# Patient Record
Sex: Female | Born: 1955 | Race: White | Hispanic: No | Marital: Single
Health system: Southern US, Community
[De-identification: ages and names within clinical notes are randomized; demographics above are authoritative.]

## PROBLEM LIST (undated history)

## (undated) DIAGNOSIS — G4733 Obstructive sleep apnea (adult) (pediatric): Secondary | ICD-10-CM

## (undated) DIAGNOSIS — J9621 Acute and chronic respiratory failure with hypoxia: Secondary | ICD-10-CM

## (undated) DIAGNOSIS — A419 Sepsis, unspecified organism: Secondary | ICD-10-CM

## (undated) DIAGNOSIS — U071 COVID-19: Secondary | ICD-10-CM

## (undated) DIAGNOSIS — R6520 Severe sepsis without septic shock: Secondary | ICD-10-CM

---

## 2021-02-04 ENCOUNTER — Other Ambulatory Visit (HOSPITAL_COMMUNITY): Payer: Medicaid Other

## 2021-02-04 ENCOUNTER — Inpatient Hospital Stay
Admission: AD | Admit: 2021-02-04 | Discharge: 2021-02-20 | Disposition: A | Discharge status: Rehabilitation Facility | Payer: 59 | Source: Other Acute Inpatient Hospital

## 2021-02-04 DIAGNOSIS — U071 COVID-19: Secondary | ICD-10-CM | POA: Diagnosis present

## 2021-02-04 DIAGNOSIS — A419 Sepsis, unspecified organism: Secondary | ICD-10-CM | POA: Diagnosis present

## 2021-02-04 DIAGNOSIS — J9690 Respiratory failure, unspecified, unspecified whether with hypoxia or hypercapnia: Secondary | ICD-10-CM

## 2021-02-04 DIAGNOSIS — G4733 Obstructive sleep apnea (adult) (pediatric): Secondary | ICD-10-CM | POA: Diagnosis present

## 2021-02-04 DIAGNOSIS — Z9911 Dependence on respirator [ventilator] status: Secondary | ICD-10-CM

## 2021-02-04 DIAGNOSIS — J9621 Acute and chronic respiratory failure with hypoxia: Secondary | ICD-10-CM | POA: Diagnosis present

## 2021-02-04 DIAGNOSIS — R6520 Severe sepsis without septic shock: Secondary | ICD-10-CM | POA: Diagnosis present

## 2021-02-04 DIAGNOSIS — N2889 Other specified disorders of kidney and ureter: Secondary | ICD-10-CM

## 2021-02-04 DIAGNOSIS — Z431 Encounter for attention to gastrostomy: Secondary | ICD-10-CM

## 2021-02-04 HISTORY — DX: Morbid (severe) obesity due to excess calories: E66.01

## 2021-02-04 HISTORY — DX: Obstructive sleep apnea (adult) (pediatric): G47.33

## 2021-02-04 HISTORY — DX: Acute and chronic respiratory failure with hypoxia: J96.21

## 2021-02-04 HISTORY — DX: Sepsis, unspecified organism: R65.20

## 2021-02-04 HISTORY — DX: COVID-19: U07.1

## 2021-02-04 HISTORY — DX: Sepsis, unspecified organism: A41.9

## 2021-02-04 LAB — BLOOD GAS, ARTERIAL
Acid-Base Excess: 0.9 mmol/L (ref 0.0–2.0)
Bicarbonate: 25.2 mmol/L (ref 20.0–28.0)
FIO2: 50
O2 Saturation: 99 %
Patient temperature: 36.6
pCO2 arterial: 40.6 mmHg (ref 32.0–48.0)
pH, Arterial: 7.407 (ref 7.350–7.450)
pO2, Arterial: 129 mmHg — ABNORMAL HIGH (ref 83.0–108.0)

## 2021-02-04 MED ORDER — IOHEXOL 350 MG/ML SOLN
50.0000 mL | Freq: Once | INTRAVENOUS | Status: AC | PRN
  Administered 2021-02-04: 50 mL

## 2021-02-05 LAB — HEMOGLOBIN A1C
Hgb A1c MFr Bld: 5.6 % (ref 4.8–5.6)
Mean Plasma Glucose: 114.02 mg/dL

## 2021-02-05 LAB — CBC WITH DIFFERENTIAL/PLATELET
Abs Immature Granulocytes: 0.16 10*3/uL — ABNORMAL HIGH (ref 0.00–0.07)
Basophils Absolute: 0.1 10*3/uL (ref 0.0–0.1)
Basophils Relative: 1 %
Eosinophils Absolute: 0.5 10*3/uL (ref 0.0–0.5)
Eosinophils Relative: 5 %
HCT: 30.9 % — ABNORMAL LOW (ref 36.0–46.0)
Hemoglobin: 9.8 g/dL — ABNORMAL LOW (ref 12.0–15.0)
Immature Granulocytes: 2 %
Lymphocytes Relative: 22 %
Lymphs Abs: 2.4 10*3/uL (ref 0.7–4.0)
MCH: 30.1 pg (ref 26.0–34.0)
MCHC: 31.7 g/dL (ref 30.0–36.0)
MCV: 94.8 fL (ref 80.0–100.0)
Monocytes Absolute: 0.9 10*3/uL (ref 0.1–1.0)
Monocytes Relative: 9 %
Neutro Abs: 6.8 10*3/uL (ref 1.7–7.7)
Neutrophils Relative %: 61 %
Platelets: 409 10*3/uL — ABNORMAL HIGH (ref 150–400)
RBC: 3.26 MIL/uL — ABNORMAL LOW (ref 3.87–5.11)
RDW: 15.2 % (ref 11.5–15.5)
WBC: 11 10*3/uL — ABNORMAL HIGH (ref 4.0–10.5)
nRBC: 0 % (ref 0.0–0.2)

## 2021-02-05 LAB — URINALYSIS, ROUTINE W REFLEX MICROSCOPIC
Bilirubin Urine: NEGATIVE
Glucose, UA: NEGATIVE mg/dL
Ketones, ur: NEGATIVE mg/dL
Leukocytes,Ua: NEGATIVE
Nitrite: NEGATIVE
Protein, ur: NEGATIVE mg/dL
Specific Gravity, Urine: 1.025 (ref 1.005–1.030)
pH: 6 (ref 5.0–8.0)

## 2021-02-05 LAB — COMPREHENSIVE METABOLIC PANEL
ALT: 62 U/L — ABNORMAL HIGH (ref 0–44)
AST: 28 U/L (ref 15–41)
Albumin: 2.6 g/dL — ABNORMAL LOW (ref 3.5–5.0)
Alkaline Phosphatase: 126 U/L (ref 38–126)
Anion gap: 8 (ref 5–15)
BUN: 9 mg/dL (ref 8–23)
CO2: 28 mmol/L (ref 22–32)
Calcium: 9.1 mg/dL (ref 8.9–10.3)
Chloride: 98 mmol/L (ref 98–111)
Creatinine, Ser: 0.6 mg/dL (ref 0.44–1.00)
GFR, Estimated: >60 mL/min (ref >60)
Glucose, Bld: 102 mg/dL — ABNORMAL HIGH (ref 70–99)
Potassium: 4 mmol/L (ref 3.5–5.1)
Sodium: 134 mmol/L — ABNORMAL LOW (ref 135–145)
Total Bilirubin: 0.5 mg/dL (ref 0.3–1.2)
Total Protein: 7.1 g/dL (ref 6.5–8.1)

## 2021-02-05 LAB — URINALYSIS, MICROSCOPIC (REFLEX)

## 2021-02-05 LAB — TSH: TSH: 6.603 u[IU]/mL — ABNORMAL HIGH (ref 0.350–4.500)

## 2021-02-05 LAB — PHOSPHORUS: Phosphorus: 4.1 mg/dL (ref 2.5–4.6)

## 2021-02-05 LAB — PROTIME-INR
INR: 1.1 (ref 0.8–1.2)
Prothrombin Time: 13.9 seconds (ref 11.4–15.2)

## 2021-02-05 LAB — MAGNESIUM: Magnesium: 1.7 mg/dL (ref 1.7–2.4)

## 2021-02-06 ENCOUNTER — Other Ambulatory Visit (HOSPITAL_COMMUNITY): Payer: Medicaid Other

## 2021-02-06 DIAGNOSIS — J9621 Acute and chronic respiratory failure with hypoxia: Secondary | ICD-10-CM

## 2021-02-06 DIAGNOSIS — A419 Sepsis, unspecified organism: Secondary | ICD-10-CM

## 2021-02-06 DIAGNOSIS — Z9911 Dependence on respirator [ventilator] status: Secondary | ICD-10-CM

## 2021-02-06 DIAGNOSIS — U071 COVID-19: Secondary | ICD-10-CM | POA: Diagnosis not present

## 2021-02-06 DIAGNOSIS — G4733 Obstructive sleep apnea (adult) (pediatric): Secondary | ICD-10-CM | POA: Diagnosis not present

## 2021-02-06 DIAGNOSIS — R6520 Severe sepsis without septic shock: Secondary | ICD-10-CM

## 2021-02-06 LAB — BASIC METABOLIC PANEL
Anion gap: 8 (ref 5–15)
BUN: 12 mg/dL (ref 8–23)
CO2: 28 mmol/L (ref 22–32)
Calcium: 8.8 mg/dL — ABNORMAL LOW (ref 8.9–10.3)
Chloride: 97 mmol/L — ABNORMAL LOW (ref 98–111)
Creatinine, Ser: 0.6 mg/dL (ref 0.44–1.00)
GFR, Estimated: >60 mL/min (ref >60)
Glucose, Bld: 117 mg/dL — ABNORMAL HIGH (ref 70–99)
Potassium: 3.7 mmol/L (ref 3.5–5.1)
Sodium: 133 mmol/L — ABNORMAL LOW (ref 135–145)

## 2021-02-06 LAB — CBC
HCT: 28.7 % — ABNORMAL LOW (ref 36.0–46.0)
Hemoglobin: 9.3 g/dL — ABNORMAL LOW (ref 12.0–15.0)
MCH: 30.3 pg (ref 26.0–34.0)
MCHC: 32.4 g/dL (ref 30.0–36.0)
MCV: 93.5 fL (ref 80.0–100.0)
Platelets: 362 10*3/uL (ref 150–400)
RBC: 3.07 MIL/uL — ABNORMAL LOW (ref 3.87–5.11)
RDW: 15.1 % (ref 11.5–15.5)
WBC: 9 10*3/uL (ref 4.0–10.5)
nRBC: 0 % (ref 0.0–0.2)

## 2021-02-06 LAB — MAGNESIUM: Magnesium: 1.9 mg/dL (ref 1.7–2.4)

## 2021-02-06 LAB — PHOSPHORUS: Phosphorus: 4.4 mg/dL (ref 2.5–4.6)

## 2021-02-06 NOTE — Consult Note (Signed)
Pulmonary Critical Care Medicine [facility] [place]  PULMONARY SERVICE  Date of Service: 02/06/2021  PULMONARY CRITICAL CARE CONSULT   Rickita Forstner  VHQ:[id number]  DOB: 12/17/1955   DOA: 02/04/2021  Referring Physician: Carron Curie, MD  HPI: Martha Medina is a 65 y.o. female seen for follow up of Acute on Chronic Respiratory Failure.  Patient has multiple medical problems including morbid obesity type 2 diabetes hyperlipidemia sleep apnea came into the hospital with shortness of breath lethargy.  The patient was noted to be febrile and septic at the time of evaluation.  CT angiogram unremarkable but patient had multifocal pneumonia noted.  The patient got worse with respiratory failure and ended up on BiPAP eventually intubated on mechanical ventilation.  Subsequently was not able to come off of the mechanical ventilation and so therefore had to have a tracheostomy done.  Patient transferred now to our facility for further management.  Other complications included development of sepsis with enterococcal bacteremia.  Review of Systems:  ROS performed and is unremarkable other than noted above.   Past Medical History:  Diagnosis Date  . Anxiety  . Carpal tunnel syndrome  . Chest pain 2021  after exercise, recent, hypoglycemic  . Dental crown present  . Depression  20 years ago  . Diabetes mellitus (*)  AIC 7.3 01/2020. type 2  . Essential hypertension 11/05/2020  . Family history of rheumatoid arthritis  . Fatty liver  . GERD (gastroesophageal reflux disease)  . H/O steroid therapy 02/2020  Right foot  . Hx of corticosteroid therapy 07/2020  Injected in Right foot  . Hypertension  . Numbness of feet  . Pneumonia 2012  . PONV (postoperative nausea and vomiting)  . Presence of dental bridge  left lower  . S/P epidural steroid injection 09/2020  . Salivary gland swelling  . Scalp lesion  . Sleep apnea  Uses Cpap  . Thyroid nodule  . Tingling  right arm       Past Surgical History:  Procedure Laterality Date  . Augmentation mammaplasty Bilateral 2012  . Colonoscopy w/ polypectomy 02/2020  . Dilation and curettage of uterus 09/29/2020  . Eye surgery Bilateral 2005  Lasik  . Foot surgery Right 04/2018  . Hip arthroplasty Left 04/15/2020  . Hysterectomy 12/24/2020  . Nasal septum surgery 2021  . Robotic hysterectomy, bso, sentinel node mapping 12/24/2020  . Salivary gland surgery 2002  . Transumbilical augmentation mammaplasty 2012   Family History  Problem Relation Age of Onset  . Hypertension Mother  . Diabetes Father  . Hypertension Father  . Diabetes Sister  . Cancer Sister  . Breast cancer Sister 90  . Diabetes Maternal Grandmother  . Hypertension Maternal Grandmother  . Cancer Paternal Grandmother  . Breast cancer Paternal Grandmother 64  . Diabetes Paternal Grandfather  . Cancer Paternal Grandfather   Social History   Socioeconomic History  . Marital status: Single  Spouse name: Not on file  . Number of children: Not on file  . Years of education: Not on file  . Highest education level: Not on file  Occupational History  . Not on file  Tobacco Use  . Smoking status: Former Smoker  Packs/day: 1.00  Years: 18.00  Pack years: 18.00  Types: Cigarettes  Quit date: 2010     Medications: Reviewed on Rounds  Physical Exam:  Vitals: Temperature is 97.2 pulse 95 respiratory 20 blood pressure 102/54 saturations 98%  Ventilator Settings on pressure support FiO2 40% pressure 12/5  .  General: Comfortable at this time . Eyes: Grossly normal lids, irises & conjunctiva . ENT: grossly tongue is normal . Neck: no obvious mass . Cardiovascular: S1-S2 normal no gallop or rub . Respiratory: Scattered rhonchi expansion is equal . Abdomen: Soft and nontender . Skin: no rash seen on limited exam . Musculoskeletal: not rigid . Psychiatric:unable to assess . Neurologic: no seizure no involuntary movements          Labs on Admission:  Basic Metabolic Panel: Recent Labs  Lab 02/05/21 0500 02/06/21 0331  NA 134* 133*  K 4.0 3.7  CL 98 97*  CO2 28 28  GLUCOSE 102* 117*  BUN 9 12  CREATININE 0.60 0.60  CALCIUM 9.1 8.8*  MG 1.7 1.9  PHOS 4.1 4.4    Recent Labs  Lab 02/04/21 1756  PHART 7.407  PCO2ART 40.6  PO2ART 129*  HCO3 25.2  O2SAT 99.0    Liver Function Tests: Recent Labs  Lab 02/05/21 0500  AST 28  ALT 62*  ALKPHOS 126  BILITOT 0.5  PROT 7.1  ALBUMIN 2.6*   No results for input(s): LIPASE, AMYLASE in the last 168 hours. No results for input(s): AMMONIA in the last 168 hours.  CBC: Recent Labs  Lab 02/05/21 0500 02/06/21 0331  WBC 11.0* 9.0  NEUTROABS 6.8  --   HGB 9.8* 9.3*  HCT 30.9* 28.7*  MCV 94.8 93.5  PLT 409* 362    Cardiac Enzymes: No results for input(s): CKTOTAL, CKMB, CKMBINDEX, TROPONINI in the last 168 hours.  BNP (last 3 results) No results for input(s): BNP in the last 8760 hours.  ProBNP (last 3 results) No results for input(s): PROBNP in the last 8760 hours.   Radiological Exams on Admission: DG Chest Port 1 View  Result Date: 02/04/2021 CLINICAL DATA:  Peg adjustment and respiratory failure. EXAM: PORTABLE CHEST 1 VIEW COMPARISON:  None. FINDINGS: A tracheostomy tube is in place. Mild diffuse chronic appearing increased interstitial lung markings are seen. Moderate severity areas of atelectasis and/or infiltrate are seen within the bilateral lung bases and throughout the periphery of both lungs. There is no evidence of a pleural effusion or pneumothorax. Degenerative changes seen throughout the thoracic spine. IMPRESSION: 1. Chronic interstitial lung disease with moderate severity bilateral atelectasis and/or infiltrate. Electronically Signed   By: Aram Candela M.D.   On: 02/04/2021 16:04   DG Abd Portable 1V  Result Date: 02/04/2021 CLINICAL DATA:  Peg tube adjustment. EXAM: PORTABLE ABDOMEN - 1 VIEW COMPARISON:  None. FINDINGS:  The bowel gas pattern is normal. A percutaneous gastrostomy tube is seen with its distal tip and insufflator bulb overlying the body of the stomach. Radiopaque contrast is seen within the body of the stomach and throughout the duodenum, without evidence of contrast extravasation or free air. IMPRESSION: Appropriate positioning of percutaneous gastrostomy tube. Electronically Signed   By: Aram Candela M.D.   On: 02/04/2021 16:02    Assessment/Plan Active Problems:   Acute on chronic respiratory failure with hypoxia (HCC)   Morbid obesity due to excess calories (HCC)   Severe obstructive sleep apnea   Severe sepsis (HCC)   COVID-19 virus infection   1. Acute on chronic respiratory failure hypoxia patient continues on pressure support 40% FiO2 pressure of 12/5 the goal is to wean for 8 hours. 2. Super morbid obesity dietary management 3. Severe sleep apnea patient will probably need positive airway pressure once patient is at a point of weaning off the ventilator. 4. Severe sepsis treated  resolved apparently had grown Enterococcus. 5. COVID-19 virus infection apparently was positive on BAL has been treated with steroids.  I have personally seen and evaluated the patient, evaluated laboratory and imaging results, formulated the assessment and plan and placed orders. The Patient requires high complexity decision making with multiple systems involvement.  Case was discussed on Rounds with the Respiratory Therapy Director and the Respiratory staff Time Spent  Yevonne Pax, MD [facility] Pulmonary Critical Care Medicine Sleep Medicine

## 2021-02-07 DIAGNOSIS — G4733 Obstructive sleep apnea (adult) (pediatric): Secondary | ICD-10-CM | POA: Diagnosis not present

## 2021-02-07 DIAGNOSIS — U071 COVID-19: Secondary | ICD-10-CM | POA: Diagnosis not present

## 2021-02-07 DIAGNOSIS — J9621 Acute and chronic respiratory failure with hypoxia: Secondary | ICD-10-CM | POA: Diagnosis not present

## 2021-02-07 LAB — URINE CULTURE: Culture: 20000 — AB

## 2021-02-07 LAB — CULTURE, RESPIRATORY W GRAM STAIN: Gram Stain: NONE SEEN

## 2021-02-07 NOTE — Progress Notes (Signed)
Pulmonary Critical Care Medicine [facility] [place]   PULMONARY CRITICAL CARE SERVICE  PROGRESS NOTE  Date of Service: 02/07/2021  Martha Medina  EHU:[id number]  DOB: 1956-04-30   DOA: 02/04/2021  Referring Physician: Carron Curie, MD  HPI: Martha Medina is a 65 y.o. female seen for follow up of Acute on Chronic Respiratory Failure.  Patient is afebrile right now resting comfortably on weaning protocol on pressure support  Medications: Reviewed on Rounds  Physical Exam:  Vitals: Temperature is 97.2 pulse 75 respiratory rate is 25 blood pressure is 102/69 saturations 95%  Ventilator Settings on pressure support FiO2 is 40% pressure 12/5  . General: Comfortable at this time . Eyes: Grossly normal lids, irises & conjunctiva . ENT: grossly tongue is normal . Neck: no obvious mass . Cardiovascular: S1 S2 normal no gallop . Respiratory: Scattered rhonchi expansion equal . Abdomen: soft . Skin: no rash seen on limited exam . Musculoskeletal: not rigid . Psychiatric:unable to assess . Neurologic: no seizure no involuntary movements         Lab Data:   Basic Metabolic Panel: Recent Labs  Lab 02/05/21 0500 02/06/21 0331  NA 134* 133*  K 4.0 3.7  CL 98 97*  CO2 28 28  GLUCOSE 102* 117*  BUN 9 12  CREATININE 0.60 0.60  CALCIUM 9.1 8.8*  MG 1.7 1.9  PHOS 4.1 4.4    ABG: Recent Labs  Lab 02/04/21 1756  PHART 7.407  PCO2ART 40.6  PO2ART 129*  HCO3 25.2  O2SAT 99.0    Liver Function Tests: Recent Labs  Lab 02/05/21 0500  AST 28  ALT 62*  ALKPHOS 126  BILITOT 0.5  PROT 7.1  ALBUMIN 2.6*   No results for input(s): LIPASE, AMYLASE in the last 168 hours. No results for input(s): AMMONIA in the last 168 hours.  CBC: Recent Labs  Lab 02/05/21 0500 02/06/21 0331  WBC 11.0* 9.0  NEUTROABS 6.8  --   HGB 9.8* 9.3*  HCT 30.9* 28.7*  MCV 94.8 93.5  PLT 409* 362    Cardiac Enzymes: No results for input(s): CKTOTAL, CKMB, CKMBINDEX,  TROPONINI in the last 168 hours.  BNP (last 3 results) No results for input(s): BNP in the last 8760 hours.  ProBNP (last 3 results) No results for input(s): PROBNP in the last 8760 hours.  Radiological Exams: US RENAL  Result Date: 02/06/2021 CLINICAL DATA:  Left renal mass. EXAM: RENAL / URINARY TRACT ULTRASOUND COMPLETE COMPARISON:  None. FINDINGS: Right Kidney: Renal measurements: 11.4 x 4.6 x 5.9 cm = volume: 160.0 mL. Echogenicity within normal limits. No mass or hydronephrosis visualized. Left Kidney: Renal measurements: 12.2 x 7.0 x 6.6 cm = volume: 292.3 mL. Echogenicity within normal limits. A cyst measuring 2.6 x 2.7 x 2.7 cm is seen in the midpole. No solid mass or hydronephrosis visualized. Bladder: Appears normal for degree of bladder distention. Other: None. IMPRESSION: Simple left renal cyst.  The exam is otherwise negative. Electronically Signed   By: Drusilla Kanner M.D.   On: 02/06/2021 12:45    Assessment/Plan Active Problems:   Acute on chronic respiratory failure with hypoxia (HCC)   Morbid obesity due to excess calories (HCC)   Severe obstructive sleep apnea   Severe sepsis (HCC)   COVID-19 virus infection   1. Acute on chronic respiratory failure hypoxia we will continue with pressure support titrate oxygen as tolerated continue pulmonary toilet 2. Morbid obesity no change we will continue to follow 3. Severe obstructive  sleep apnea at baseline 4. Severe sepsis resolved 5. COVID-19 virus infection in recovery phase   I have personally seen and evaluated the patient, evaluated laboratory and imaging results, formulated the assessment and plan and placed orders. The Patient requires high complexity decision making with multiple systems involvement.  Rounds were done with the Respiratory Therapy Director and Staff therapists and discussed with nursing staff also.  Yevonne Pax, MD [facility] Pulmonary Critical Care Medicine Sleep Medicine

## 2021-02-08 DIAGNOSIS — U071 COVID-19: Secondary | ICD-10-CM | POA: Diagnosis not present

## 2021-02-08 DIAGNOSIS — J9621 Acute and chronic respiratory failure with hypoxia: Secondary | ICD-10-CM | POA: Diagnosis not present

## 2021-02-08 DIAGNOSIS — G4733 Obstructive sleep apnea (adult) (pediatric): Secondary | ICD-10-CM | POA: Diagnosis not present

## 2021-02-08 LAB — CBC
HCT: 30.3 % — ABNORMAL LOW (ref 36.0–46.0)
Hemoglobin: 9.4 g/dL — ABNORMAL LOW (ref 12.0–15.0)
MCH: 29.6 pg (ref 26.0–34.0)
MCHC: 31 g/dL (ref 30.0–36.0)
MCV: 95.3 fL (ref 80.0–100.0)
Platelets: 340 10*3/uL (ref 150–400)
RBC: 3.18 MIL/uL — ABNORMAL LOW (ref 3.87–5.11)
RDW: 14.9 % (ref 11.5–15.5)
WBC: 10 10*3/uL (ref 4.0–10.5)
nRBC: 0 % (ref 0.0–0.2)

## 2021-02-08 LAB — PHOSPHORUS: Phosphorus: 2.9 mg/dL (ref 2.5–4.6)

## 2021-02-08 LAB — BASIC METABOLIC PANEL
Anion gap: 8 (ref 5–15)
BUN: 13 mg/dL (ref 8–23)
CO2: 30 mmol/L (ref 22–32)
Calcium: 9.2 mg/dL (ref 8.9–10.3)
Chloride: 95 mmol/L — ABNORMAL LOW (ref 98–111)
Creatinine, Ser: 0.57 mg/dL (ref 0.44–1.00)
GFR, Estimated: >60 mL/min (ref >60)
Glucose, Bld: 104 mg/dL — ABNORMAL HIGH (ref 70–99)
Potassium: 4.2 mmol/L (ref 3.5–5.1)
Sodium: 133 mmol/L — ABNORMAL LOW (ref 135–145)

## 2021-02-08 LAB — MAGNESIUM: Magnesium: 1.8 mg/dL (ref 1.7–2.4)

## 2021-02-09 DIAGNOSIS — J9621 Acute and chronic respiratory failure with hypoxia: Secondary | ICD-10-CM | POA: Diagnosis not present

## 2021-02-09 DIAGNOSIS — G4733 Obstructive sleep apnea (adult) (pediatric): Secondary | ICD-10-CM | POA: Diagnosis not present

## 2021-02-09 DIAGNOSIS — U071 COVID-19: Secondary | ICD-10-CM | POA: Diagnosis not present

## 2021-02-09 NOTE — Progress Notes (Signed)
Pulmonary Critical Care Medicine [facility] [place]   PULMONARY CRITICAL CARE SERVICE  PROGRESS NOTE  Date of Service: 02/09/2021   Martha Medina  GYB:[id number]  DOB: 03-Jan-1956   DOA: 02/04/2021  Referring Physician: Carron Curie, MD  HPI: Martha Medina is a 66 y.o. female seen for follow up of Acute on Chronic Respiratory Failure.  Patient is comfortable right now without distress has been on pressure support  Medications: Reviewed on Rounds  Physical Exam:  Vitals: Temperature is 98.7 pulse 78 respiratory 30 blood pressure is 102/64 saturations 97  Ventilator Settings on pressure support FiO2 is 40% pressure of 12/5  . General: Comfortable at this time . Eyes: Grossly normal lids, irises & conjunctiva . ENT: grossly tongue is normal . Neck: no obvious mass . Cardiovascular: S1 S2 normal no gallop . Respiratory: Scattered rhonchi expansion is equal . Abdomen: soft . Skin: no rash seen on limited exam . Musculoskeletal: not rigid . Psychiatric:unable to assess . Neurologic: no seizure no involuntary movements         Lab Data:   Basic Metabolic Panel: Recent Labs  Lab 02/05/21 0500 02/06/21 0331 02/08/21 0658  NA 134* 133* 133*  K 4.0 3.7 4.2  CL 98 97* 95*  CO2 28 28 30   GLUCOSE 102* 117* 104*  BUN 9 12 13   CREATININE 0.60 0.60 0.57  CALCIUM 9.1 8.8* 9.2  MG 1.7 1.9 1.8  PHOS 4.1 4.4 2.9    ABG: Recent Labs  Lab 02/04/21 1756  PHART 7.407  PCO2ART 40.6  PO2ART 129*  HCO3 25.2  O2SAT 99.0    Liver Function Tests: Recent Labs  Lab 02/05/21 0500  AST 28  ALT 62*  ALKPHOS 126  BILITOT 0.5  PROT 7.1  ALBUMIN 2.6*   No results for input(s): LIPASE, AMYLASE in the last 168 hours. No results for input(s): AMMONIA in the last 168 hours.  CBC: Recent Labs  Lab 02/05/21 0500 02/06/21 0331 02/08/21 0658  WBC 11.0* 9.0 10.0  NEUTROABS 6.8  --   --   HGB 9.8* 9.3* 9.4*  HCT 30.9* 28.7* 30.3*  MCV 94.8 93.5 95.3  PLT 409*  362 340    Cardiac Enzymes: No results for input(s): CKTOTAL, CKMB, CKMBINDEX, TROPONINI in the last 168 hours.  BNP (last 3 results) No results for input(s): BNP in the last 8760 hours.  ProBNP (last 3 results) No results for input(s): PROBNP in the last 8760 hours.  Radiological Exams: No results found.  Assessment/Plan Active Problems:   Acute on chronic respiratory failure with hypoxia (HCC)   Morbid obesity due to excess calories (HCC)   Severe obstructive sleep apnea   Severe sepsis (HCC)   COVID-19 virus infection   1. Acute on chronic respiratory failure hypoxia goal of 16 hours on pressure support 2. Morbid obesity supportive care 3. Severe sleep apnea nonissue 4. Severe sepsis resolved 5. COVID-19 virus infection in recovery   I have personally seen and evaluated the patient, evaluated laboratory and imaging results, formulated the assessment and plan and placed orders. The Patient requires high complexity decision making with multiple systems involvement.  Rounds were done with the Respiratory Therapy Director and Staff therapists and discussed with nursing staff also.  02/08/21, MD [facility] Pulmonary Critical Care Medicine Sleep Medicine

## 2021-02-09 NOTE — Progress Notes (Signed)
Pulmonary Critical Care Medicine [facility] [place]   PULMONARY CRITICAL CARE SERVICE  PROGRESS NOTE    [name]  MPN:[id number]  DOB: [birth year]   DOA: 02/04/2021  Referring Physician: Carron Curie, MD  HPI: Martha Medina is a 65 y.o. female seen for follow up of Acute on Chronic Respiratory Failure.  Patient right now is afebrile without distress remains on pressure support  Medications: Reviewed on Rounds  Physical Exam:  Vitals: Temperature is 97.0 pulse 92 respiratory 20 blood pressure is 112/63 saturations 93%  Ventilator Settings on pressure support FiO2 40% pressure 12/5  . General: Comfortable at this time . Eyes: Grossly normal lids, irises & conjunctiva . ENT: grossly tongue is normal . Neck: no obvious mass . Cardiovascular: S1 S2 normal no gallop . Respiratory: Scattered rhonchi expansion is equal at this time . Abdomen: soft . Skin: no rash seen on limited exam . Musculoskeletal: not rigid . Psychiatric:unable to assess . Neurologic: no seizure no involuntary movements         Lab Data:   Basic Metabolic Panel: Recent Labs  Lab 02/05/21 0500 02/06/21 0331 02/08/21 0658  NA 134* 133* 133*  K 4.0 3.7 4.2  CL 98 97* 95*  CO2 28 28 30   GLUCOSE 102* 117* 104*  BUN 9 12 13   CREATININE 0.60 0.60 0.57  CALCIUM 9.1 8.8* 9.2  MG 1.7 1.9 1.8  PHOS 4.1 4.4 2.9    ABG: Recent Labs  Lab 02/04/21 1756  PHART 7.407  PCO2ART 40.6  PO2ART 129*  HCO3 25.2  O2SAT 99.0    Liver Function Tests: Recent Labs  Lab 02/05/21 0500  AST 28  ALT 62*  ALKPHOS 126  BILITOT 0.5  PROT 7.1  ALBUMIN 2.6*   No results for input(s): LIPASE, AMYLASE in the last 168 hours. No results for input(s): AMMONIA in the last 168 hours.  CBC: Recent Labs  Lab 02/05/21 0500 02/06/21 0331 02/08/21 0658  WBC 11.0* 9.0 10.0  NEUTROABS 6.8  --   --   HGB 9.8* 9.3* 9.4*  HCT 30.9* 28.7* 30.3*  MCV 94.8 93.5 95.3  PLT 409* 362 340    Cardiac  Enzymes: No results for input(s): CKTOTAL, CKMB, CKMBINDEX, TROPONINI in the last 168 hours.  BNP (last 3 results) No results for input(s): BNP in the last 8760 hours.  ProBNP (last 3 results) No results for input(s): PROBNP in the last 8760 hours.  Radiological Exams: No results found.  Assessment/Plan Active Problems:   Acute on chronic respiratory failure with hypoxia (HCC)   Morbid obesity due to excess calories (HCC)   Severe obstructive sleep apnea   Severe sepsis (HCC)   COVID-19 virus infection   1. Acute on chronic respiratory failure hypoxia we will continue with the pressure support patient is on pressure of 12/5 2. Morbid obesity no change supportive care 3. Severe sleep apnea has airway in place 4. Severe sepsis resolved 5. COVID-19 virus infection in resolution phase   I have personally seen and evaluated the patient, evaluated laboratory and imaging results, formulated the assessment and plan and placed orders. The Patient requires high complexity decision making with multiple systems involvement.  Rounds were done with the Respiratory Therapy Director and Staff therapists and discussed with nursing staff also.  02/10/21, MD [facility] Pulmonary Critical Care Medicine Sleep Medicine

## 2021-02-10 DIAGNOSIS — U071 COVID-19: Secondary | ICD-10-CM | POA: Diagnosis not present

## 2021-02-10 DIAGNOSIS — J9621 Acute and chronic respiratory failure with hypoxia: Secondary | ICD-10-CM | POA: Diagnosis not present

## 2021-02-10 DIAGNOSIS — G4733 Obstructive sleep apnea (adult) (pediatric): Secondary | ICD-10-CM | POA: Diagnosis not present

## 2021-02-10 LAB — VANCOMYCIN, TROUGH: Vancomycin Tr: 7 ug/mL — ABNORMAL LOW (ref 15–20)

## 2021-02-10 NOTE — Progress Notes (Signed)
Pulmonary Critical Care Medicine [facility] [place]   PULMONARY CRITICAL CARE SERVICE  PROGRESS NOTE  Date of Service: 02/10/2021   Martha Medina  DEY:[id number]  DOB: 24-Nov-1955   DOA: 02/04/2021  Referring Physician: Carron Curie, MD  HPI: Martha Medina is a 65 y.o. female seen for follow up of Acute on Chronic Respiratory Failure.  Patient is on 2: Comfortable right now without distress  Medications: Reviewed on Rounds  Physical Exam:  Vitals: Temperature is 96.2 pulse 78 respiratory 28 blood pressure 112/66  Ventilator Settings on T collar  . General: Comfortable at this time . Eyes: Grossly normal lids, irises & conjunctiva . ENT: grossly tongue is normal . Neck: no obvious mass . Cardiovascular: S1 S2 normal no gallop . Respiratory: No rhonchi no rales noted at this time . Abdomen: soft . Skin: no rash seen on limited exam . Musculoskeletal: not rigid . Psychiatric:unable to assess . Neurologic: no seizure no involuntary movements         Lab Data:   Basic Metabolic Panel: Recent Labs  Lab 02/05/21 0500 02/06/21 0331 02/08/21 0658  NA 134* 133* 133*  K 4.0 3.7 4.2  CL 98 97* 95*  CO2 28 28 30   GLUCOSE 102* 117* 104*  BUN 9 12 13   CREATININE 0.60 0.60 0.57  CALCIUM 9.1 8.8* 9.2  MG 1.7 1.9 1.8  PHOS 4.1 4.4 2.9    ABG: Recent Labs  Lab 02/04/21 1756  PHART 7.407  PCO2ART 40.6  PO2ART 129*  HCO3 25.2  O2SAT 99.0    Liver Function Tests: Recent Labs  Lab 02/05/21 0500  AST 28  ALT 62*  ALKPHOS 126  BILITOT 0.5  PROT 7.1  ALBUMIN 2.6*   No results for input(s): LIPASE, AMYLASE in the last 168 hours. No results for input(s): AMMONIA in the last 168 hours.  CBC: Recent Labs  Lab 02/05/21 0500 02/06/21 0331 02/08/21 0658  WBC 11.0* 9.0 10.0  NEUTROABS 6.8  --   --   HGB 9.8* 9.3* 9.4*  HCT 30.9* 28.7* 30.3*  MCV 94.8 93.5 95.3  PLT 409* 362 340    Cardiac Enzymes: No results for input(s): CKTOTAL, CKMB,  CKMBINDEX, TROPONINI in the last 168 hours.  BNP (last 3 results) No results for input(s): BNP in the last 8760 hours.  ProBNP (last 3 results) No results for input(s): PROBNP in the last 8760 hours.  Radiological Exams: No results found.  Assessment/Plan Active Problems:   Acute on chronic respiratory failure with hypoxia (HCC)   Morbid obesity due to excess calories (HCC)   Severe obstructive sleep apnea   Severe sepsis (HCC)   COVID-19 virus infection   1. Acute on chronic respiratory failure hypoxia the main goal for T collar is 2 hours today 2. Morbid obesity supportive care dietary management 3. Severe sleep apnea nonissue 4. Severe sepsis resolved 5. COVID-19 virus infection resolved   I have personally seen and evaluated the patient, evaluated laboratory and imaging results, formulated the assessment and plan and placed orders. The Patient requires high complexity decision making with multiple systems involvement.  Rounds were done with the Respiratory Therapy Director and Staff therapists and discussed with nursing staff also.  02/08/21, MD [facility] Pulmonary Critical Care Medicine Sleep Medicine

## 2021-02-11 DIAGNOSIS — G4733 Obstructive sleep apnea (adult) (pediatric): Secondary | ICD-10-CM | POA: Diagnosis not present

## 2021-02-11 DIAGNOSIS — J9621 Acute and chronic respiratory failure with hypoxia: Secondary | ICD-10-CM | POA: Diagnosis not present

## 2021-02-11 DIAGNOSIS — U071 COVID-19: Secondary | ICD-10-CM | POA: Diagnosis not present

## 2021-02-12 ENCOUNTER — Other Ambulatory Visit (HOSPITAL_COMMUNITY): Payer: Medicaid Other

## 2021-02-12 DIAGNOSIS — U071 COVID-19: Secondary | ICD-10-CM | POA: Diagnosis not present

## 2021-02-12 DIAGNOSIS — G4733 Obstructive sleep apnea (adult) (pediatric): Secondary | ICD-10-CM | POA: Diagnosis not present

## 2021-02-12 DIAGNOSIS — J9621 Acute and chronic respiratory failure with hypoxia: Secondary | ICD-10-CM | POA: Diagnosis not present

## 2021-02-12 LAB — BLOOD GAS, ARTERIAL
Acid-Base Excess: 3.7 mmol/L — ABNORMAL HIGH (ref 0.0–2.0)
Acid-Base Excess: 3.2 mmol/L — ABNORMAL HIGH (ref 0.0–2.0)
Bicarbonate: 28.2 mmol/L — ABNORMAL HIGH (ref 20.0–28.0)
Bicarbonate: 27 mmol/L (ref 20.0–28.0)
FIO2: 35
FIO2: 35
O2 Saturation: 47.3 %
O2 Saturation: 95 %
Patient temperature: 37
Patient temperature: 37
pCO2 arterial: 46.9 mmHg (ref 32.0–48.0)
pCO2 arterial: 40.2 mmHg (ref 32.0–48.0)
pH, Arterial: 7.397 (ref 7.350–7.450)
pH, Arterial: 7.443 (ref 7.350–7.450)
pO2, Arterial: <31 mmHg — CL (ref 83.0–108.0)
pO2, Arterial: 73.3 mmHg — ABNORMAL LOW (ref 83.0–108.0)

## 2021-02-12 LAB — MAGNESIUM: Magnesium: 1.8 mg/dL (ref 1.7–2.4)

## 2021-02-12 LAB — CBC
HCT: 28.6 % — ABNORMAL LOW (ref 36.0–46.0)
Hemoglobin: 9.2 g/dL — ABNORMAL LOW (ref 12.0–15.0)
MCH: 30.9 pg (ref 26.0–34.0)
MCHC: 32.2 g/dL (ref 30.0–36.0)
MCV: 96 fL (ref 80.0–100.0)
Platelets: 274 10*3/uL (ref 150–400)
RBC: 2.98 MIL/uL — ABNORMAL LOW (ref 3.87–5.11)
RDW: 15.7 % — ABNORMAL HIGH (ref 11.5–15.5)
WBC: 9.5 10*3/uL (ref 4.0–10.5)
nRBC: 0 % (ref 0.0–0.2)

## 2021-02-12 LAB — BASIC METABOLIC PANEL
Anion gap: 8 (ref 5–15)
BUN: 12 mg/dL (ref 8–23)
CO2: 29 mmol/L (ref 22–32)
Calcium: 9 mg/dL (ref 8.9–10.3)
Chloride: 96 mmol/L — ABNORMAL LOW (ref 98–111)
Creatinine, Ser: 0.64 mg/dL (ref 0.44–1.00)
GFR, Estimated: >60 mL/min (ref >60)
Glucose, Bld: 133 mg/dL — ABNORMAL HIGH (ref 70–99)
Potassium: 4.6 mmol/L (ref 3.5–5.1)
Sodium: 133 mmol/L — ABNORMAL LOW (ref 135–145)

## 2021-02-12 LAB — VANCOMYCIN, TROUGH: Vancomycin Tr: 18 ug/mL (ref 15–20)

## 2021-02-12 NOTE — Progress Notes (Signed)
Pulmonary Critical Care Medicine [facility] [place]   PULMONARY CRITICAL CARE SERVICE  PROGRESS NOTE  Date of Service: 02/12/2021   Martha Medina  PZW:[id number]  DOB: November 10, 1956   DOA: 02/04/2021  Referring Physician: Carron Curie, MD  HPI: Martha Medina is a 65 y.o. female seen for follow up of Acute on Chronic Respiratory Failure.  Patient is on T collar on 35% FiO2 goal of 48 hours  Medications: Reviewed on Rounds  Physical Exam:  Vitals: Temperature is 98.0 pulse 89 respiratory rate 26 blood pressure is 147/82 saturations 98%  Ventilator Settings on T collar FiO2 35%  . General: Comfortable at this time . Eyes: Grossly normal lids, irises & conjunctiva . ENT: grossly tongue is normal . Neck: no obvious mass . Cardiovascular: S1 S2 normal no gallop . Respiratory: Scattered rhonchi expansion is equal . Abdomen: soft . Skin: no rash seen on limited exam . Musculoskeletal: not rigid . Psychiatric:unable to assess . Neurologic: no seizure no involuntary movements         Lab Data:   Basic Metabolic Panel: Recent Labs  Lab 02/06/21 0331 02/08/21 0658 02/12/21 0536  NA 133* 133* 133*  K 3.7 4.2 4.6  CL 97* 95* 96*  CO2 28 30 29   GLUCOSE 117* 104* 133*  BUN 12 13 12   CREATININE 0.60 0.57 0.64  CALCIUM 8.8* 9.2 9.0  MG 1.9 1.8 1.8  PHOS 4.4 2.9  --     ABG: Recent Labs  Lab 02/12/21 0815  PHART 7.397  PCO2ART 46.9  PO2ART <31.0*  HCO3 28.2*  O2SAT 47.3    Liver Function Tests: No results for input(s): AST, ALT, ALKPHOS, BILITOT, PROT, ALBUMIN in the last 168 hours. No results for input(s): LIPASE, AMYLASE in the last 168 hours. No results for input(s): AMMONIA in the last 168 hours.  CBC: Recent Labs  Lab 02/06/21 0331 02/08/21 0658 02/12/21 0536  WBC 9.0 10.0 9.5  HGB 9.3* 9.4* 9.2*  HCT 28.7* 30.3* 28.6*  MCV 93.5 95.3 96.0  PLT 362 340 274    Cardiac Enzymes: No results for input(s): CKTOTAL, CKMB, CKMBINDEX,  TROPONINI in the last 168 hours.  BNP (last 3 results) No results for input(s): BNP in the last 8760 hours.  ProBNP (last 3 results) No results for input(s): PROBNP in the last 8760 hours.  Radiological Exams: No results found.  Assessment/Plan Active Problems:   Acute on chronic respiratory failure with hypoxia (HCC)   Morbid obesity due to excess calories (HCC)   Severe obstructive sleep apnea   Severe sepsis (HCC)   COVID-19 virus infection   1. Acute on chronic respiratory failure with hypoxia we will continue with weaning on T-piece goal of 48 hours 2. Morbid obesity at baseline 3. Severe obstructive sleep apnea no change 4. Severe sepsis treated we will continue to follow 5. COVID-19 virus infection at baseline   I have personally seen and evaluated the patient, evaluated laboratory and imaging results, formulated the assessment and plan and placed orders. The Patient requires high complexity decision making with multiple systems involvement.  Rounds were done with the Respiratory Therapy Director and Staff therapists and discussed with nursing staff also.  02/10/21, MD [facility] Pulmonary Critical Care Medicine Sleep Medicine

## 2021-02-12 NOTE — Progress Notes (Signed)
Pulmonary Critical Care Medicine [facility] [place]   PULMONARY CRITICAL CARE SERVICE  PROGRESS NOTE     Martha Medina  [id number]  DOB: Apr 13, 1956   DOA: 02/04/2021  Referring Physician: Carron Curie, MD  HPI: Martha Medina is a 65 y.o. female seen for follow up of Acute on Chronic Respiratory Failure.  Patient currently is off the ventilator.  Has been afebrile comfortable without any distress  Medications: Reviewed on Rounds  Physical Exam:  Vitals: Temperature is 96.8 pulse 90 respiratory rate 30 blood pressure is 127/78 saturations 94%  Ventilator Settings on T collar with an FiO2 of 40%  . General: Comfortable at this time . Eyes: Grossly normal lids, irises & conjunctiva . ENT: grossly tongue is normal . Neck: no obvious mass . Cardiovascular: S1 S2 normal no gallop . Respiratory: Scattered rhonchi expansion is equal . Abdomen: soft . Skin: no rash seen on limited exam . Musculoskeletal: not rigid . Psychiatric:unable to assess . Neurologic: no seizure no involuntary movements         Lab Data:   Basic Metabolic Panel: Recent Labs  Lab 02/06/21 0331 02/08/21 0658 02/12/21 0536  NA 133* 133* 133*  K 3.7 4.2 4.6  CL 97* 95* 96*  CO2 28 30 29   GLUCOSE 117* 104* 133*  BUN 12 13 12   CREATININE 0.60 0.57 0.64  CALCIUM 8.8* 9.2 9.0  MG 1.9 1.8 1.8  PHOS 4.4 2.9  --     ABG: Recent Labs  Lab 02/12/21 0815  PHART 7.397  PCO2ART 46.9  PO2ART <31.0*  HCO3 28.2*  O2SAT 47.3    Liver Function Tests: No results for input(s): AST, ALT, ALKPHOS, BILITOT, PROT, ALBUMIN in the last 168 hours. No results for input(s): LIPASE, AMYLASE in the last 168 hours. No results for input(s): AMMONIA in the last 168 hours.  CBC: Recent Labs  Lab 02/06/21 0331 02/08/21 0658 02/12/21 0536  WBC 9.0 10.0 9.5  HGB 9.3* 9.4* 9.2*  HCT 28.7* 30.3* 28.6*  MCV 93.5 95.3 96.0  PLT 362 340 274    Cardiac Enzymes: No results for input(s): CKTOTAL,  CKMB, CKMBINDEX, TROPONINI in the last 168 hours.  BNP (last 3 results) No results for input(s): BNP in the last 8760 hours.  ProBNP (last 3 results) No results for input(s): PROBNP in the last 8760 hours.  Radiological Exams: No results found.  Assessment/Plan Active Problems:   Acute on chronic respiratory failure with hypoxia (HCC)   Morbid obesity due to excess calories (HCC)   Severe obstructive sleep apnea   Severe sepsis (HCC)   COVID-19 virus infection   1. Acute on chronic respiratory failure hypoxia we will continue with the 8-hour goal of weaning on T collar 2. Morbid obesity dietary management 3. Severe sleep apnea nonissue 4. Severe sepsis treated resolving 5. COVID-19 virus infection recovery phase   I have personally seen and evaluated the patient, evaluated laboratory and imaging results, formulated the assessment and plan and placed orders. The Patient requires high complexity decision making with multiple systems involvement.  Rounds were done with the Respiratory Therapy Director and Staff therapists and discussed with nursing staff also.  02/10/21, MD [facility] Pulmonary Critical Care Medicine Sleep Medicine

## 2021-02-13 DIAGNOSIS — U071 COVID-19: Secondary | ICD-10-CM | POA: Diagnosis not present

## 2021-02-13 DIAGNOSIS — J9621 Acute and chronic respiratory failure with hypoxia: Secondary | ICD-10-CM | POA: Diagnosis not present

## 2021-02-13 DIAGNOSIS — G4733 Obstructive sleep apnea (adult) (pediatric): Secondary | ICD-10-CM | POA: Diagnosis not present

## 2021-02-13 LAB — BASIC METABOLIC PANEL
Anion gap: 8 (ref 5–15)
BUN: 10 mg/dL (ref 8–23)
CO2: 30 mmol/L (ref 22–32)
Calcium: 9.5 mg/dL (ref 8.9–10.3)
Chloride: 98 mmol/L (ref 98–111)
Creatinine, Ser: 0.73 mg/dL (ref 0.44–1.00)
GFR, Estimated: >60 mL/min (ref >60)
Glucose, Bld: 110 mg/dL — ABNORMAL HIGH (ref 70–99)
Potassium: 4.7 mmol/L (ref 3.5–5.1)
Sodium: 136 mmol/L (ref 135–145)

## 2021-02-13 LAB — CBC
HCT: 30.4 % — ABNORMAL LOW (ref 36.0–46.0)
Hemoglobin: 9.7 g/dL — ABNORMAL LOW (ref 12.0–15.0)
MCH: 30.6 pg (ref 26.0–34.0)
MCHC: 31.9 g/dL (ref 30.0–36.0)
MCV: 95.9 fL (ref 80.0–100.0)
Platelets: 306 10*3/uL (ref 150–400)
RBC: 3.17 MIL/uL — ABNORMAL LOW (ref 3.87–5.11)
RDW: 15.6 % — ABNORMAL HIGH (ref 11.5–15.5)
WBC: 12 10*3/uL — ABNORMAL HIGH (ref 4.0–10.5)
nRBC: 0 % (ref 0.0–0.2)

## 2021-02-13 LAB — MAGNESIUM: Magnesium: 2.1 mg/dL (ref 1.7–2.4)

## 2021-02-13 NOTE — Progress Notes (Signed)
Pulmonary Critical Care Medicine [facility] [place]   PULMONARY CRITICAL CARE SERVICE  PROGRESS NOTE     Martha Medina  [id number]  DOB: 12/10/1955   DOA: 02/04/2021  Referring Physician: Carron Curie, MD  HPI: Martha Medina is a 65 y.o. female seen for follow up of Acute on Chronic Respiratory Failure.  Patient currently is on T collar has been on 35% FiO2 good saturations are noted  Medications: Reviewed on Rounds  Physical Exam:  Vitals: Temperature is 96.4 pulse 64 respiratory 20 blood pressure is 112/66 saturations 100%  Ventilator Settings on T collar with an FiO2 of 35%  . General: Comfortable at this time . Eyes: Grossly normal lids, irises & conjunctiva . ENT: grossly tongue is normal . Neck: no obvious mass . Cardiovascular: S1 S2 normal no gallop . Respiratory: Scattered rhonchi expansion is equal . Abdomen: soft . Skin: no rash seen on limited exam . Musculoskeletal: not rigid . Psychiatric:unable to assess . Neurologic: no seizure no involuntary movements         Lab Data:   Basic Metabolic Panel: Recent Labs  Lab 02/08/21 0658 02/12/21 0536 02/13/21 0254  NA 133* 133* 136  K 4.2 4.6 4.7  CL 95* 96* 98  CO2 30 29 30   GLUCOSE 104* 133* 110*  BUN 13 12 10   CREATININE 0.57 0.64 0.73  CALCIUM 9.2 9.0 9.5  MG 1.8 1.8 2.1  PHOS 2.9  --   --     ABG: Recent Labs  Lab 02/12/21 0815 02/12/21 1250  PHART 7.397 7.443  PCO2ART 46.9 40.2  PO2ART <31.0* 73.3*  HCO3 28.2* 27.0  O2SAT 47.3 95.0    Liver Function Tests: No results for input(s): AST, ALT, ALKPHOS, BILITOT, PROT, ALBUMIN in the last 168 hours. No results for input(s): LIPASE, AMYLASE in the last 168 hours. No results for input(s): AMMONIA in the last 168 hours.  CBC: Recent Labs  Lab 02/08/21 0658 02/12/21 0536 02/13/21 0254  WBC 10.0 9.5 12.0*  HGB 9.4* 9.2* 9.7*  HCT 30.3* 28.6* 30.4*  MCV 95.3 96.0 95.9  PLT 340 274 306    Cardiac Enzymes: No  results for input(s): CKTOTAL, CKMB, CKMBINDEX, TROPONINI in the last 168 hours.  BNP (last 3 results) No results for input(s): BNP in the last 8760 hours.  ProBNP (last 3 results) No results for input(s): PROBNP in the last 8760 hours.  Radiological Exams: No results found.  Assessment/Plan Active Problems:   Acute on chronic respiratory failure with hypoxia (HCC)   Morbid obesity due to excess calories (HCC)   Severe obstructive sleep apnea   Severe sepsis (HCC)   COVID-19 virus infection   1. Acute on chronic respiratory failure hypoxia we will continue with T collar titrate oxygen continue pulmonary toilet. 2. Morbid obesity patient is at baseline we will continue to follow 3. Severe sleep apnea no change 4. Severe sepsis treated slowly improving 5. COVID-19 virus infection recovery phase   I have personally seen and evaluated the patient, evaluated laboratory and imaging results, formulated the assessment and plan and placed orders. The Patient requires high complexity decision making with multiple systems involvement.  Rounds were done with the Respiratory Therapy Director and Staff therapists and discussed with nursing staff also.  02/14/21, MD [facility] Pulmonary Critical Care Medicine Sleep Medicine

## 2021-02-14 DIAGNOSIS — U071 COVID-19: Secondary | ICD-10-CM | POA: Diagnosis not present

## 2021-02-14 DIAGNOSIS — G4733 Obstructive sleep apnea (adult) (pediatric): Secondary | ICD-10-CM | POA: Diagnosis not present

## 2021-02-14 DIAGNOSIS — J9621 Acute and chronic respiratory failure with hypoxia: Secondary | ICD-10-CM | POA: Diagnosis not present

## 2021-02-14 NOTE — Progress Notes (Signed)
Pulmonary Critical Care Medicine [facility] [place]   PULMONARY CRITICAL CARE SERVICE  PROGRESS NOTE     Martha Medina  [id number]  DOB: [birth year]   DOA: 02/04/2021  Referring Physician: Carron Curie, MD  HPI: Martha Medina is a 65 y.o. female seen for follow up of Acute on Chronic Respiratory Failure.  Patient right now is on T collar on 60% FiO2.  Has had some desaturations but overall doing better  Medications: Reviewed on Rounds  Physical Exam:  Vitals: Temperature is 97.2 pulse 63 respiratory 29 blood pressure 106/61 saturations 95%  Ventilator Settings off the ventilator on T collar  . General: Comfortable at this time . Eyes: Grossly normal lids, irises & conjunctiva . ENT: grossly tongue is normal . Neck: no obvious mass . Cardiovascular: S1 S2 normal no gallop . Respiratory: No rhonchi no rales noted at this time . Abdomen: soft . Skin: no rash seen on limited exam . Musculoskeletal: not rigid . Psychiatric:unable to assess . Neurologic: no seizure no involuntary movements         Lab Data:   Basic Metabolic Panel: Recent Labs  Lab 02/08/21 0658 02/12/21 0536 02/13/21 0254  NA 133* 133* 136  K 4.2 4.6 4.7  CL 95* 96* 98  CO2 30 29 30   GLUCOSE 104* 133* 110*  BUN 13 12 10   CREATININE 0.57 0.64 0.73  CALCIUM 9.2 9.0 9.5  MG 1.8 1.8 2.1  PHOS 2.9  --   --     ABG: Recent Labs  Lab 02/12/21 0815 02/12/21 1250  PHART 7.397 7.443  PCO2ART 46.9 40.2  PO2ART <31.0* 73.3*  HCO3 28.2* 27.0  O2SAT 47.3 95.0    Liver Function Tests: No results for input(s): AST, ALT, ALKPHOS, BILITOT, PROT, ALBUMIN in the last 168 hours. No results for input(s): LIPASE, AMYLASE in the last 168 hours. No results for input(s): AMMONIA in the last 168 hours.  CBC: Recent Labs  Lab 02/08/21 0658 02/12/21 0536 02/13/21 0254  WBC 10.0 9.5 12.0*  HGB 9.4* 9.2* 9.7*  HCT 30.3* 28.6* 30.4*  MCV 95.3 96.0 95.9  PLT 340 274 306    Cardiac  Enzymes: No results for input(s): CKTOTAL, CKMB, CKMBINDEX, TROPONINI in the last 168 hours.  BNP (last 3 results) No results for input(s): BNP in the last 8760 hours.  ProBNP (last 3 results) No results for input(s): PROBNP in the last 8760 hours.  Radiological Exams: No results found.  Assessment/Plan Active Problems:   Acute on chronic respiratory failure with hypoxia (HCC)   Morbid obesity due to excess calories (HCC)   Severe obstructive sleep apnea   Severe sepsis (HCC)   COVID-19 virus infection   1. Acute on chronic respiratory failure hypoxia we will continue with the T-piece.  Continue secretion management supportive care. 2. Morbid obesity no change dietary management 3. Severe OSA nonissue at this time we will continue to monitor 4. Severe sepsis treated resolved 5. COVID-19 virus infection recovery phase   I have personally seen and evaluated the patient, evaluated laboratory and imaging results, formulated the assessment and plan and placed orders. The Patient requires high complexity decision making with multiple systems involvement.  Rounds were done with the Respiratory Therapy Director and Staff therapists and discussed with nursing staff also.  02/14/21, MD [facility] Pulmonary Critical Care Medicine Sleep Medicine

## 2021-02-15 DIAGNOSIS — U071 COVID-19: Secondary | ICD-10-CM | POA: Diagnosis not present

## 2021-02-15 DIAGNOSIS — G4733 Obstructive sleep apnea (adult) (pediatric): Secondary | ICD-10-CM | POA: Diagnosis not present

## 2021-02-15 DIAGNOSIS — J9621 Acute and chronic respiratory failure with hypoxia: Secondary | ICD-10-CM | POA: Diagnosis not present

## 2021-02-15 NOTE — Progress Notes (Signed)
Pulmonary Critical Care Medicine [facility] [place]   PULMONARY CRITICAL CARE SERVICE  PROGRESS NOTE     Nahla Lukin  [id number]  DOB: 03/07/1956   DOA: 02/04/2021  Referring Physician: Carron Curie, MD  HPI: Martha Medina is a 65 y.o. female seen for follow up of Acute on Chronic Respiratory Failure.  Patient is capping right now on 2 L of oxygen doing quite well.  Medications: Reviewed on Rounds  Physical Exam:  Vitals: Temperature is 98.0 pulse 102 respiratory 30 blood pressure is 163/54 saturations 96%  Ventilator Settings capping off the ventilator  . General: Comfortable at this time . Eyes: Grossly normal lids, irises & conjunctiva . ENT: grossly tongue is normal . Neck: no obvious mass . Cardiovascular: S1 S2 normal no gallop . Respiratory: No rhonchi no rales . Abdomen: soft . Skin: no rash seen on limited exam . Musculoskeletal: not rigid . Psychiatric:unable to assess . Neurologic: no seizure no involuntary movements         Lab Data:   Basic Metabolic Panel: Recent Labs  Lab 02/12/21 0536 02/13/21 0254  NA 133* 136  K 4.6 4.7  CL 96* 98  CO2 29 30  GLUCOSE 133* 110*  BUN 12 10  CREATININE 0.64 0.73  CALCIUM 9.0 9.5  MG 1.8 2.1    ABG: Recent Labs  Lab 02/12/21 0815 02/12/21 1250  PHART 7.397 7.443  PCO2ART 46.9 40.2  PO2ART <31.0* 73.3*  HCO3 28.2* 27.0  O2SAT 47.3 95.0    Liver Function Tests: No results for input(s): AST, ALT, ALKPHOS, BILITOT, PROT, ALBUMIN in the last 168 hours. No results for input(s): LIPASE, AMYLASE in the last 168 hours. No results for input(s): AMMONIA in the last 168 hours.  CBC: Recent Labs  Lab 02/12/21 0536 02/13/21 0254  WBC 9.5 12.0*  HGB 9.2* 9.7*  HCT 28.6* 30.4*  MCV 96.0 95.9  PLT 274 306    Cardiac Enzymes: No results for input(s): CKTOTAL, CKMB, CKMBINDEX, TROPONINI in the last 168 hours.  BNP (last 3 results) No results for input(s): BNP in the last 8760  hours.  ProBNP (last 3 results) No results for input(s): PROBNP in the last 8760 hours.  Radiological Exams: No results found.  Assessment/Plan Active Problems:   Acute on chronic respiratory failure with hypoxia (HCC)   Morbid obesity due to excess calories (HCC)   Severe obstructive sleep apnea   Severe sepsis (HCC)   COVID-19 virus infection   1. Acute on chronic respiratory failure hypoxia plan is going to be to continue with advancing the capping trials patient is doing fine with 2 L we will continue to follow along. 2. Morbid obesity dietary management 3. Severe sleep apnea currently is not an issue will monitor for any desaturations 4. Severe sepsis treated slow improvement 5. COVID-19 virus infection in recovery   I have personally seen and evaluated the patient, evaluated laboratory and imaging results, formulated the assessment and plan and placed orders. The Patient requires high complexity decision making with multiple systems involvement.  Rounds were done with the Respiratory Therapy Director and Staff therapists and discussed with nursing staff also.  Yevonne Pax, MD [facility] Pulmonary Critical Care Medicine Sleep Medicine

## 2021-02-16 DIAGNOSIS — U071 COVID-19: Secondary | ICD-10-CM | POA: Diagnosis not present

## 2021-02-16 DIAGNOSIS — J9621 Acute and chronic respiratory failure with hypoxia: Secondary | ICD-10-CM | POA: Diagnosis not present

## 2021-02-16 DIAGNOSIS — G4733 Obstructive sleep apnea (adult) (pediatric): Secondary | ICD-10-CM | POA: Diagnosis not present

## 2021-02-16 LAB — CBC
HCT: 27.6 % — ABNORMAL LOW (ref 36.0–46.0)
Hemoglobin: 9 g/dL — ABNORMAL LOW (ref 12.0–15.0)
MCH: 31.3 pg (ref 26.0–34.0)
MCHC: 32.6 g/dL (ref 30.0–36.0)
MCV: 95.8 fL (ref 80.0–100.0)
Platelets: 302 10*3/uL (ref 150–400)
RBC: 2.88 MIL/uL — ABNORMAL LOW (ref 3.87–5.11)
RDW: 16.3 % — ABNORMAL HIGH (ref 11.5–15.5)
WBC: 12.9 10*3/uL — ABNORMAL HIGH (ref 4.0–10.5)
nRBC: 0 % (ref 0.0–0.2)

## 2021-02-16 LAB — BASIC METABOLIC PANEL
Anion gap: 8 (ref 5–15)
BUN: 10 mg/dL (ref 8–23)
CO2: 27 mmol/L (ref 22–32)
Calcium: 9.1 mg/dL (ref 8.9–10.3)
Chloride: 100 mmol/L (ref 98–111)
Creatinine, Ser: 0.74 mg/dL (ref 0.44–1.00)
GFR, Estimated: >60 mL/min (ref >60)
Glucose, Bld: 118 mg/dL — ABNORMAL HIGH (ref 70–99)
Potassium: 4.1 mmol/L (ref 3.5–5.1)
Sodium: 135 mmol/L (ref 135–145)

## 2021-02-16 LAB — PHOSPHORUS: Phosphorus: 4.7 mg/dL — ABNORMAL HIGH (ref 2.5–4.6)

## 2021-02-16 LAB — MAGNESIUM: Magnesium: 2 mg/dL (ref 1.7–2.4)

## 2021-02-16 NOTE — Progress Notes (Signed)
Pulmonary Critical Care Medicine [facility] [place]   PULMONARY CRITICAL CARE SERVICE  PROGRESS NOTE     Martha Medina  [id number]  DOB: 1956-09-24   DOA: 02/04/2021  Referring Physician: Carron Curie, MD  HPI: Martha Medina is a 65 y.o. female seen for follow up of Acute on Chronic Respiratory Failure.  Patient is on capping trials at this time 2 L nasal cannula  Medications: Reviewed on Rounds  Physical Exam:  Vitals: Temperature is 97.0 pulse 88 respiratory 20 blood pressure is 98/54 saturations 95%  Ventilator Settings capping off the ventilator at this time  . General: Comfortable at this time . Eyes: Grossly normal lids, irises & conjunctiva . ENT: grossly tongue is normal . Neck: no obvious mass . Cardiovascular: S1 S2 normal no gallop . Respiratory: Scattered rhonchi expansion is equal . Abdomen: soft . Skin: no rash seen on limited exam . Musculoskeletal: not rigid . Psychiatric:unable to assess . Neurologic: no seizure no involuntary movements         Lab Data:   Basic Metabolic Panel: Recent Labs  Lab 02/12/21 0536 02/13/21 0254 02/16/21 0548  NA 133* 136 135  K 4.6 4.7 4.1  CL 96* 98 100  CO2 29 30 27   GLUCOSE 133* 110* 118*  BUN 12 10 10   CREATININE 0.64 0.73 0.74  CALCIUM 9.0 9.5 9.1  MG 1.8 2.1 2.0  PHOS  --   --  4.7*    ABG: Recent Labs  Lab 02/12/21 0815 02/12/21 1250  PHART 7.397 7.443  PCO2ART 46.9 40.2  PO2ART <31.0* 73.3*  HCO3 28.2* 27.0  O2SAT 47.3 95.0    Liver Function Tests: No results for input(s): AST, ALT, ALKPHOS, BILITOT, PROT, ALBUMIN in the last 168 hours. No results for input(s): LIPASE, AMYLASE in the last 168 hours. No results for input(s): AMMONIA in the last 168 hours.  CBC: Recent Labs  Lab 02/12/21 0536 02/13/21 0254 02/16/21 0548  WBC 9.5 12.0* 12.9*  HGB 9.2* 9.7* 9.0*  HCT 28.6* 30.4* 27.6*  MCV 96.0 95.9 95.8  PLT 274 306 302    Cardiac Enzymes: No results for  input(s): CKTOTAL, CKMB, CKMBINDEX, TROPONINI in the last 168 hours.  BNP (last 3 results) No results for input(s): BNP in the last 8760 hours.  ProBNP (last 3 results) No results for input(s): PROBNP in the last 8760 hours.  Radiological Exams: No results found.  Assessment/Plan Active Problems:   Acute on chronic respiratory failure with hypoxia (HCC)   Morbid obesity due to excess calories (HCC)   Severe obstructive sleep apnea   Severe sepsis (HCC)   COVID-19 virus infection   1. Acute on chronic respiratory failure with hypoxia continues to wean doing well plan is to advance as tolerated. 2. Morbid obesity due to excessive calories we will continue to follow along. 3. Severe obstructive sleep apnea patient is at baseline 4. Severe sepsis treated in resolution 5. COVID-19 virus infection recovery phase   I have personally seen and evaluated the patient, evaluated laboratory and imaging results, formulated the assessment and plan and placed orders. The Patient requires high complexity decision making with multiple systems involvement.  Rounds were done with the Respiratory Therapy Director and Staff therapists and discussed with nursing staff also.  02/15/21, MD [facility] Pulmonary Critical Care Medicine Sleep Medicine

## 2021-02-17 DIAGNOSIS — G4733 Obstructive sleep apnea (adult) (pediatric): Secondary | ICD-10-CM | POA: Diagnosis not present

## 2021-02-17 DIAGNOSIS — J9621 Acute and chronic respiratory failure with hypoxia: Secondary | ICD-10-CM | POA: Diagnosis not present

## 2021-02-17 DIAGNOSIS — U071 COVID-19: Secondary | ICD-10-CM | POA: Diagnosis not present

## 2021-02-17 NOTE — Progress Notes (Signed)
Pulmonary Critical Care Medicine [facility] [place]   PULMONARY CRITICAL CARE SERVICE  PROGRESS NOTE     Martha Medina  [id number]  DOB: 28-Jun-1956   DOA: 02/04/2021  Referring Physician: Carron Curie, MD  HPI: Martha Medina is a 65 y.o. female seen for follow up of Acute on Chronic Respiratory Failure.  Patient is afebrile right now comfortable without distress has been capping is ready for decannulation  Medications: Reviewed on Rounds  Physical Exam:  Vitals: Temperature is 98.1 pulse 78 respiratory rate is 28 blood pressure is 117/64 saturations 94%  Ventilator Settings capping off the ventilator  . General: Comfortable at this time . Eyes: Grossly normal lids, irises & conjunctiva . ENT: grossly tongue is normal . Neck: no obvious mass . Cardiovascular: S1 S2 normal no gallop . Respiratory: Coarse breath sounds with few scattered rhonchi . Abdomen: soft . Skin: no rash seen on limited exam . Musculoskeletal: not rigid . Psychiatric:unable to assess . Neurologic: no seizure no involuntary movements         Lab Data:   Basic Metabolic Panel: Recent Labs  Lab 02/12/21 0536 02/13/21 0254 02/16/21 0548  NA 133* 136 135  K 4.6 4.7 4.1  CL 96* 98 100  CO2 29 30 27   GLUCOSE 133* 110* 118*  BUN 12 10 10   CREATININE 0.64 0.73 0.74  CALCIUM 9.0 9.5 9.1  MG 1.8 2.1 2.0  PHOS  --   --  4.7*    ABG: Recent Labs  Lab 02/12/21 0815 02/12/21 1250  PHART 7.397 7.443  PCO2ART 46.9 40.2  PO2ART <31.0* 73.3*  HCO3 28.2* 27.0  O2SAT 47.3 95.0    Liver Function Tests: No results for input(s): AST, ALT, ALKPHOS, BILITOT, PROT, ALBUMIN in the last 168 hours. No results for input(s): LIPASE, AMYLASE in the last 168 hours. No results for input(s): AMMONIA in the last 168 hours.  CBC: Recent Labs  Lab 02/12/21 0536 02/13/21 0254 02/16/21 0548  WBC 9.5 12.0* 12.9*  HGB 9.2* 9.7* 9.0*  HCT 28.6* 30.4* 27.6*  MCV 96.0 95.9 95.8  PLT 274 306  302    Cardiac Enzymes: No results for input(s): CKTOTAL, CKMB, CKMBINDEX, TROPONINI in the last 168 hours.  BNP (last 3 results) No results for input(s): BNP in the last 8760 hours.  ProBNP (last 3 results) No results for input(s): PROBNP in the last 8760 hours.  Radiological Exams: No results found.  Assessment/Plan Active Problems:   Acute on chronic respiratory failure with hypoxia (HCC)   Morbid obesity due to excess calories (HCC)   Severe obstructive sleep apnea   Severe sepsis (HCC)   COVID-19 virus infection   1. Acute on chronic respiratory failure with hypoxia we will continue with the advancement of the wean and proceed to decannulate 2. Morbid obesity supportive care dietary management 3. Severe sleep apnea no issues so far respiratory aware of the possible need for positive airway pressure 4. Severe sepsis treated resolved 5. COVID-19 virus infection recovery phase   I have personally seen and evaluated the patient, evaluated laboratory and imaging results, formulated the assessment and plan and placed orders. The Patient requires high complexity decision making with multiple systems involvement.  Rounds were done with the Respiratory Therapy Director and Staff therapists and discussed with nursing staff also.  02/15/21, MD [facility] Pulmonary Critical Care Medicine Sleep Medicine

## 2021-02-19 LAB — BASIC METABOLIC PANEL
Anion gap: 11 (ref 5–15)
BUN: 15 mg/dL (ref 8–23)
CO2: 21 mmol/L — ABNORMAL LOW (ref 22–32)
Calcium: 9 mg/dL (ref 8.9–10.3)
Chloride: 102 mmol/L (ref 98–111)
Creatinine, Ser: 0.74 mg/dL (ref 0.44–1.00)
GFR, Estimated: >60 mL/min (ref >60)
Glucose, Bld: 116 mg/dL — ABNORMAL HIGH (ref 70–99)
Potassium: 4.8 mmol/L (ref 3.5–5.1)
Sodium: 134 mmol/L — ABNORMAL LOW (ref 135–145)

## 2021-02-19 LAB — CBC
HCT: 29.9 % — ABNORMAL LOW (ref 36.0–46.0)
Hemoglobin: 9.5 g/dL — ABNORMAL LOW (ref 12.0–15.0)
MCH: 30.1 pg (ref 26.0–34.0)
MCHC: 31.8 g/dL (ref 30.0–36.0)
MCV: 94.6 fL (ref 80.0–100.0)
Platelets: 255 10*3/uL (ref 150–400)
RBC: 3.16 MIL/uL — ABNORMAL LOW (ref 3.87–5.11)
RDW: 16.4 % — ABNORMAL HIGH (ref 11.5–15.5)
WBC: 12.9 10*3/uL — ABNORMAL HIGH (ref 4.0–10.5)
nRBC: 0 % (ref 0.0–0.2)

## 2021-02-19 LAB — MAGNESIUM: Magnesium: 1.9 mg/dL (ref 1.7–2.4)

## 2021-02-19 LAB — PHOSPHORUS: Phosphorus: 4.9 mg/dL — ABNORMAL HIGH (ref 2.5–4.6)

## 2022-07-13 IMAGING — DX DG CHEST 1V PORT
1 series · 1 of 1 positions shown · non-contrast
Comparison: None.

CLINICAL DATA: Peg adjustment and respiratory failure.

EXAM:
PORTABLE CHEST 1 VIEW

[chest ap]
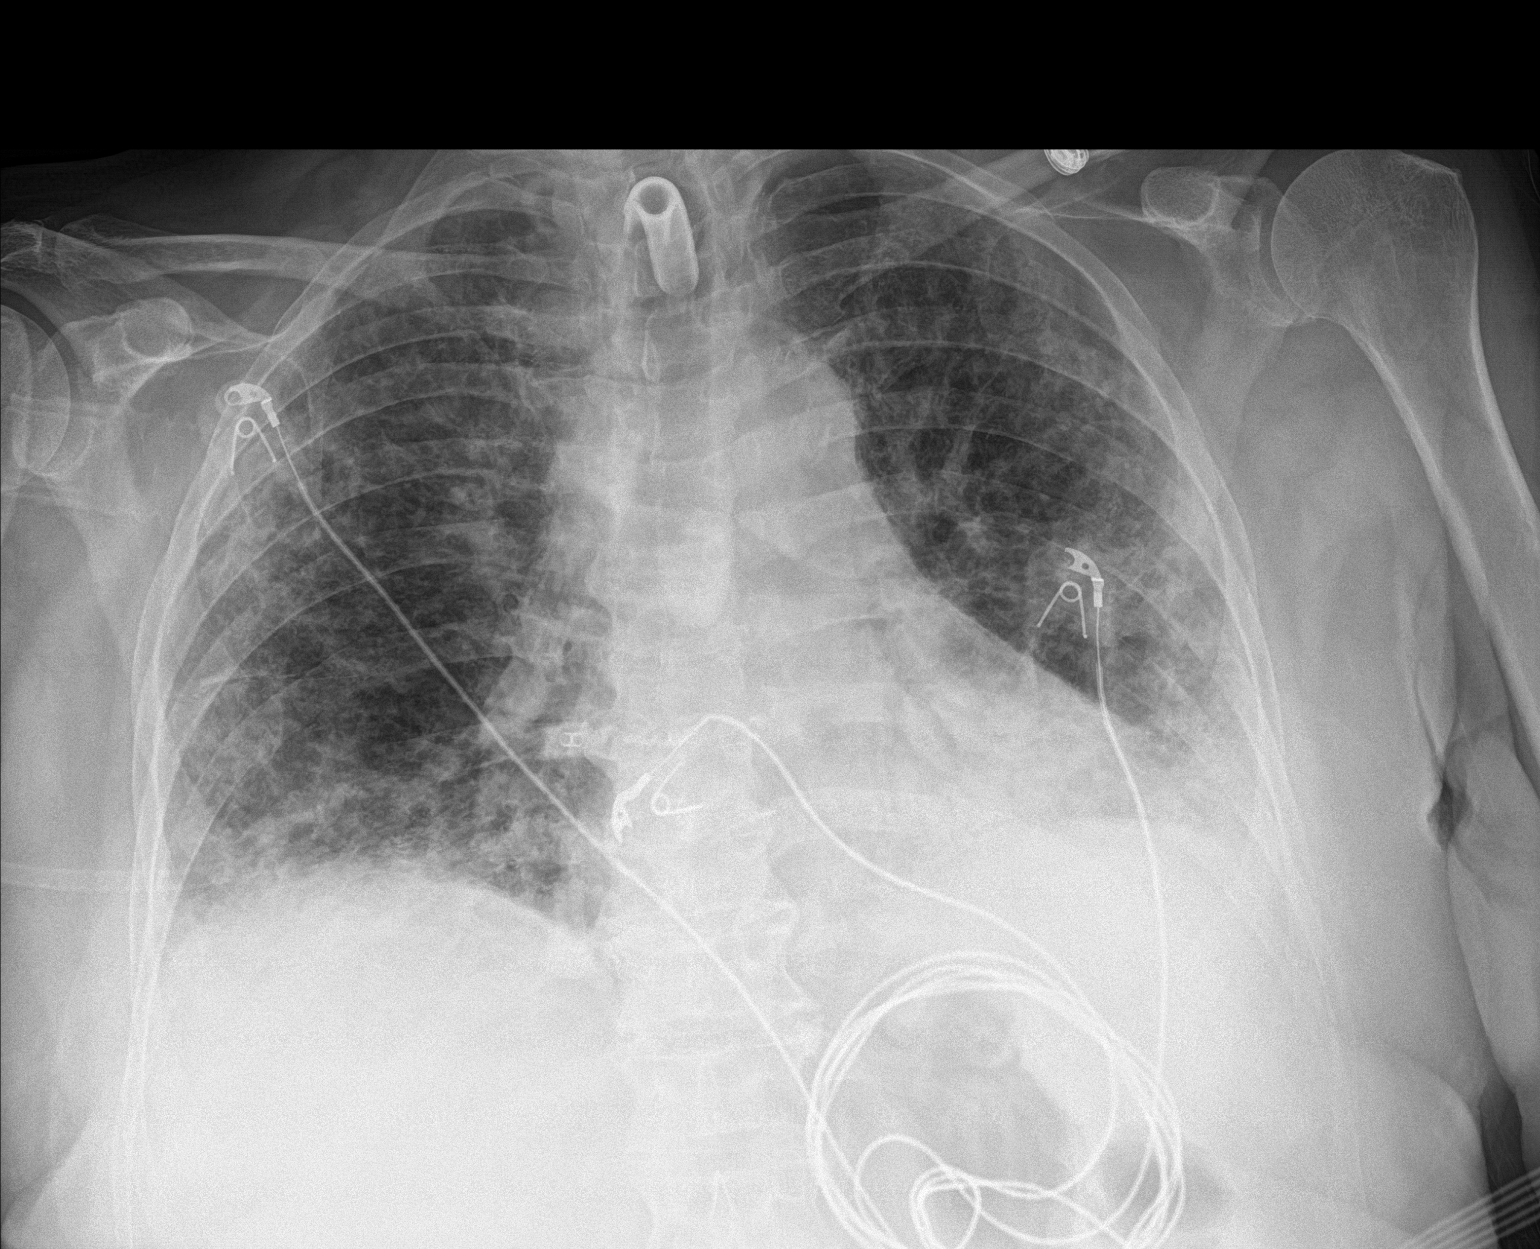

[1 of 1 positions shown; findings below may reference images not displayed]

FINDINGS: A tracheostomy tube is in place. Mild diffuse chronic appearing
increased interstitial lung markings are seen. Moderate severity
areas of atelectasis and/or infiltrate are seen within the bilateral
lung bases and throughout the periphery of both lungs. There is no
evidence of a pleural effusion or pneumothorax. Degenerative changes
seen throughout the thoracic spine.
IMPRESSION: 1. Chronic interstitial lung disease with moderate severity
bilateral atelectasis and/or infiltrate.

## 2022-07-15 IMAGING — US US RENAL
1 series · 14 of 25 positions shown · non-contrast
Comparison: None.

CLINICAL DATA: Left renal mass.

EXAM:
RENAL / URINARY TRACT ULTRASOUND COMPLETE

[Series 1: us renal · 14 of 49 slices shown]
[im 1/49]
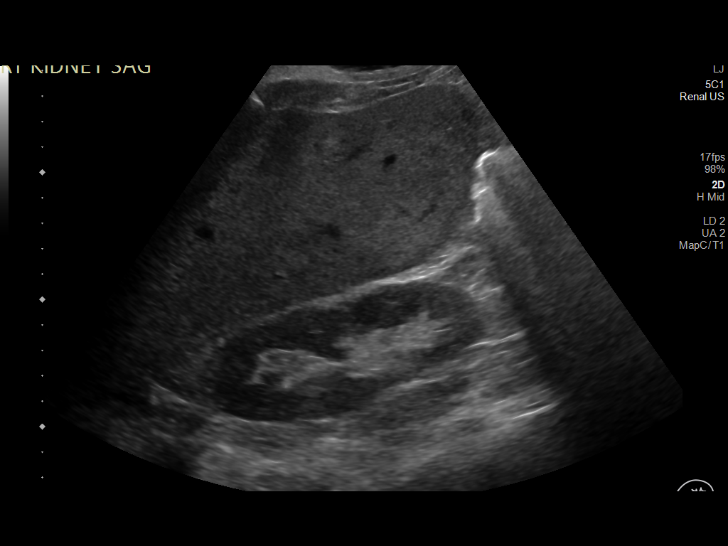
[im 5/49]
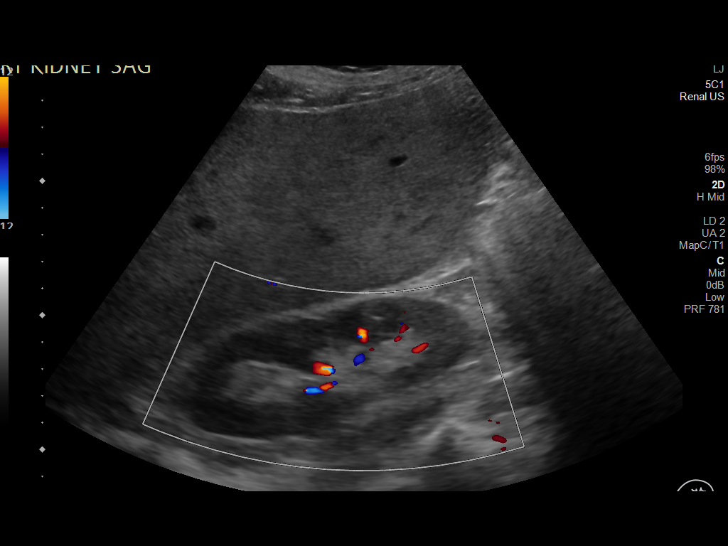
[im 9/49]
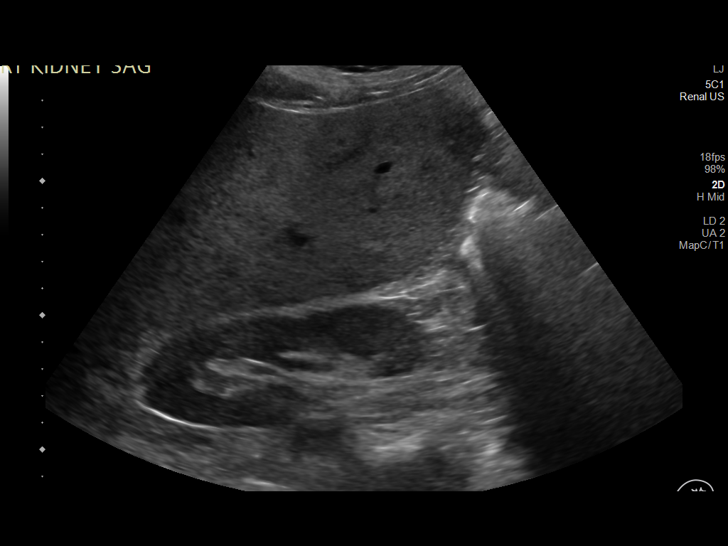
[im 13/49]
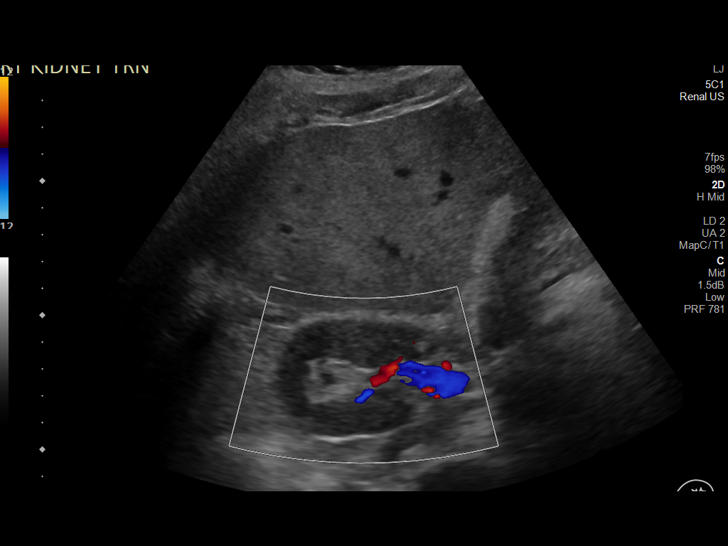
[im 17/49]
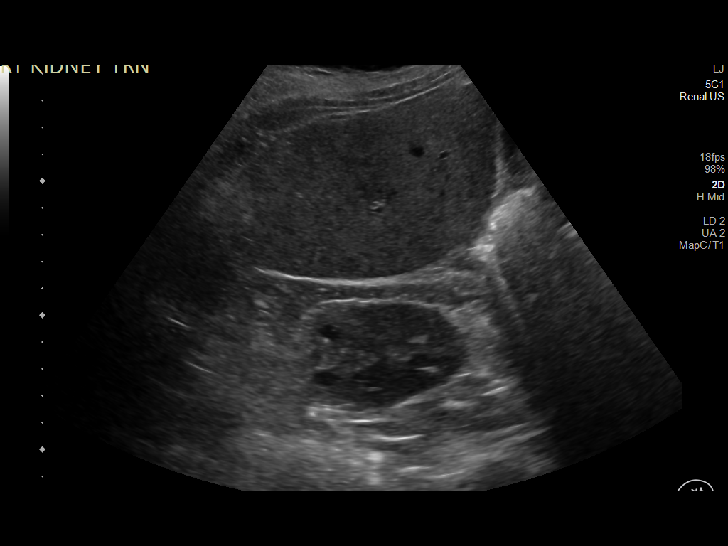
[im 19/49]
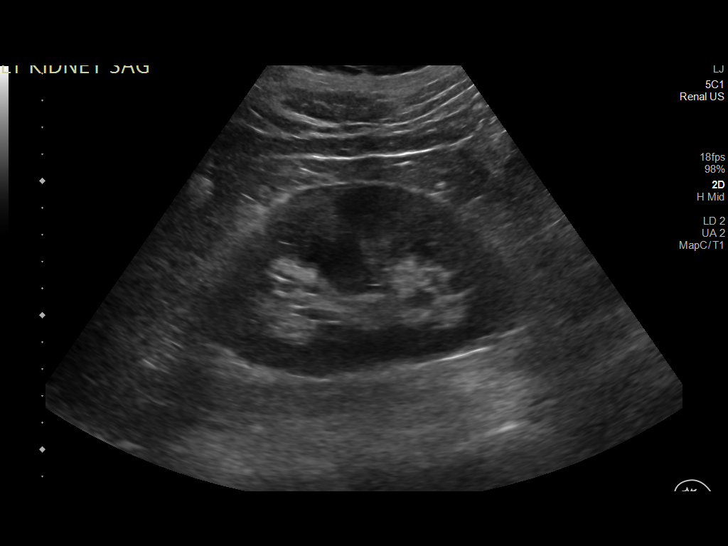
[im 23/49]
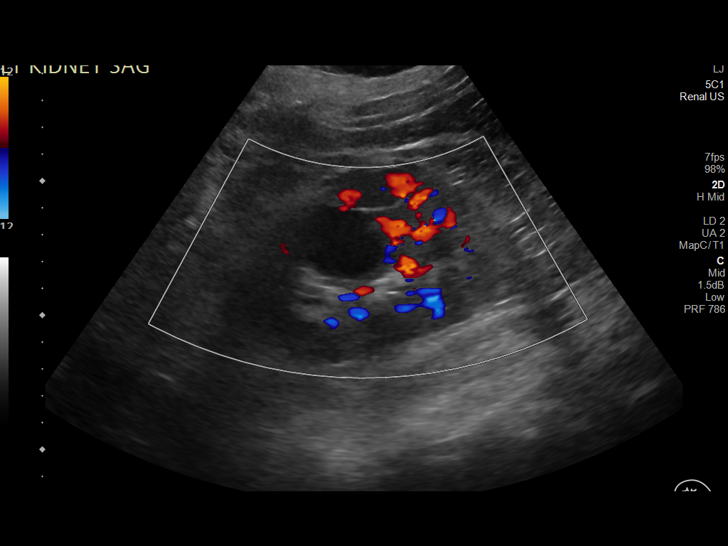
[im 27/49]
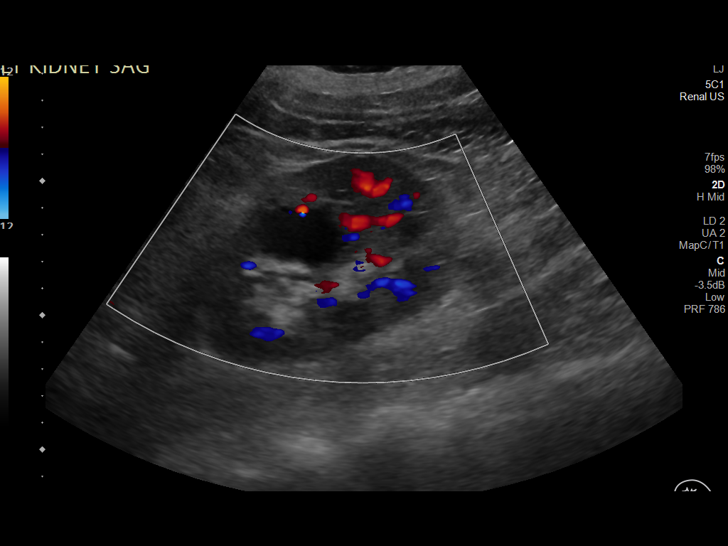
[im 31/49]
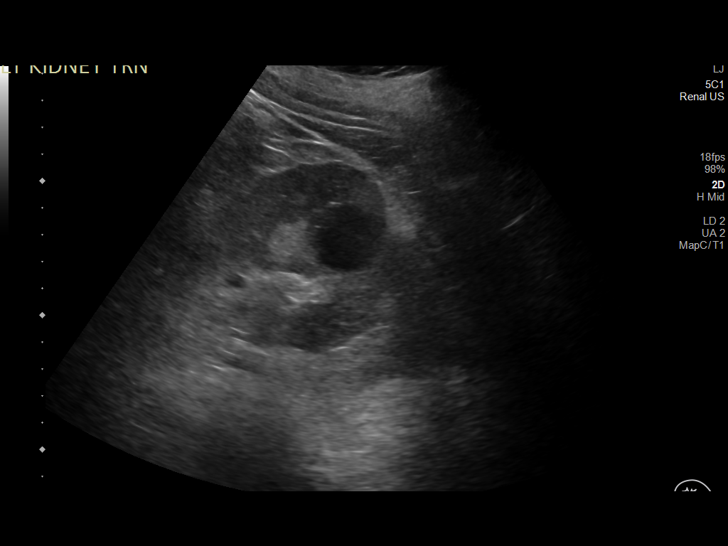
[im 33/49]
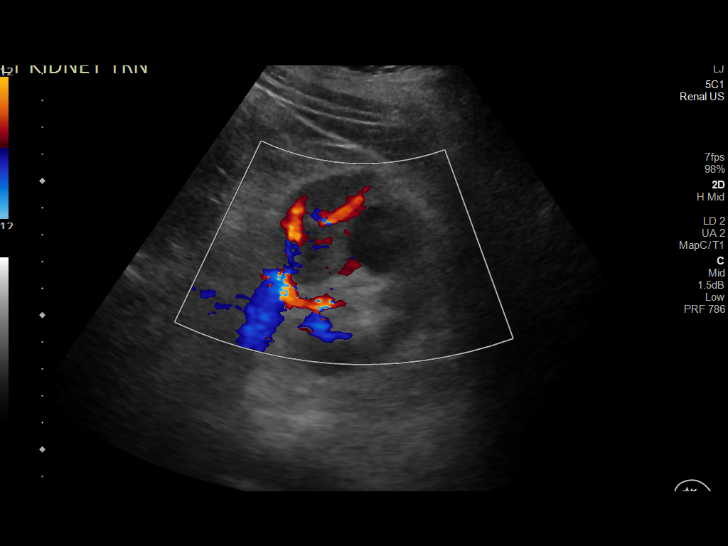
[im 37/49]
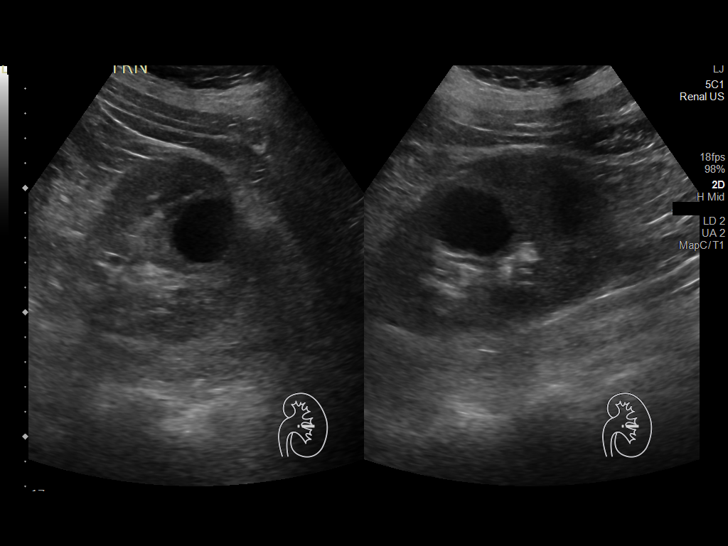
[im 41/49]
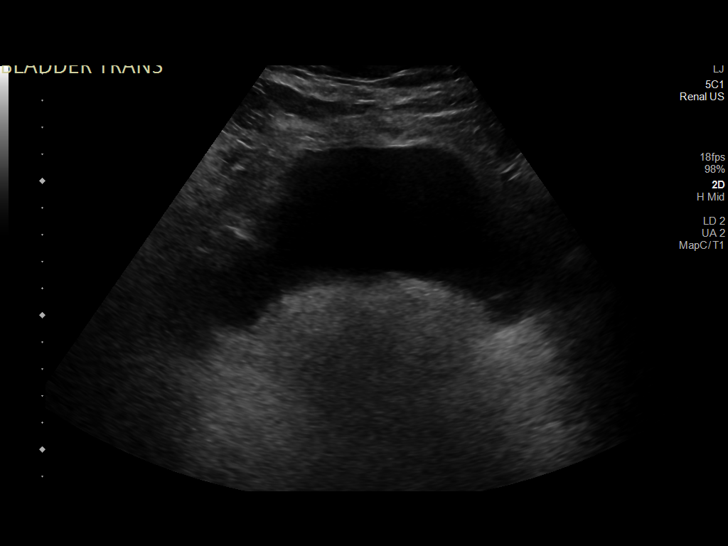
[im 45/49]
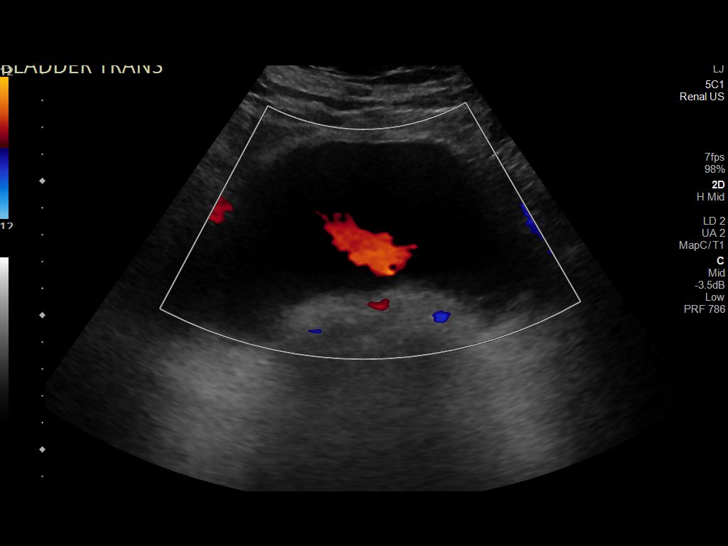
[im 49/49]
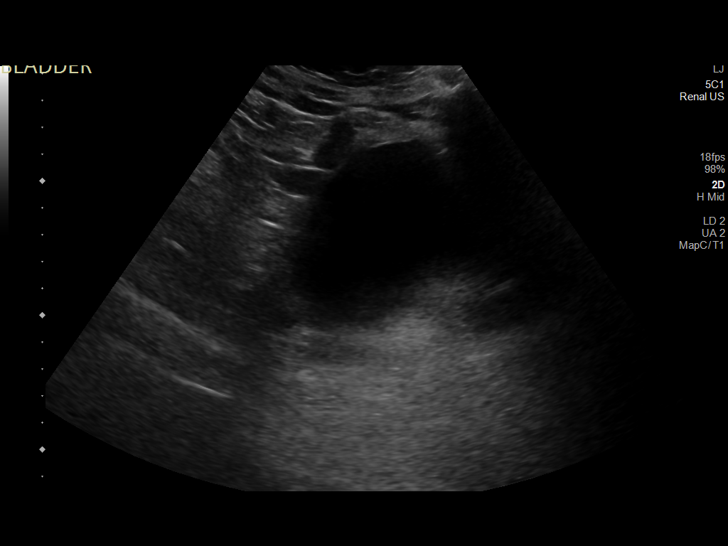

[14 of 25 positions shown; findings below may reference images not displayed]

FINDINGS: Right Kidney:

Renal measurements: 11.4 x 4.6 x 5.9 cm = volume: 160.0 mL.
Echogenicity within normal limits. No mass or hydronephrosis
visualized.

Left Kidney:

Renal measurements: 12.2 x 7.0 x 6.6 cm = volume: 292.3 mL.
Echogenicity within normal limits. A cyst measuring 2.6 x 2.7 x
cm is seen in the midpole. No solid mass or hydronephrosis
visualized.

Bladder:

Appears normal for degree of bladder distention.

Other:

None.
IMPRESSION: Simple left renal cyst.  The exam is otherwise negative.
